# Patient Record
Sex: Male | Born: 1970 | Race: White | Hispanic: No | Marital: Married | Smoking: Never smoker
Health system: Southern US, Community
[De-identification: ages and names within clinical notes are randomized; demographics above are authoritative.]

## PROBLEM LIST (undated history)

## (undated) DIAGNOSIS — I4891 Unspecified atrial fibrillation: Secondary | ICD-10-CM

## (undated) DIAGNOSIS — E7800 Pure hypercholesterolemia, unspecified: Secondary | ICD-10-CM

## (undated) DIAGNOSIS — G4730 Sleep apnea, unspecified: Secondary | ICD-10-CM

## (undated) DIAGNOSIS — I10 Essential (primary) hypertension: Secondary | ICD-10-CM

## (undated) HISTORY — PX: MANDIBLE SURGERY: SHX707

## (undated) HISTORY — PX: BARIATRIC SURGERY: SHX1103

## (undated) HISTORY — PX: ANKLE SURGERY: SHX546

---

## 2003-09-21 ENCOUNTER — Emergency Department (HOSPITAL_COMMUNITY): Admission: AC | Admit: 2003-09-21 | Discharge: 2003-09-22 | Payer: Self-pay

## 2016-10-22 ENCOUNTER — Emergency Department (HOSPITAL_BASED_OUTPATIENT_CLINIC_OR_DEPARTMENT_OTHER): Payer: No Typology Code available for payment source

## 2016-10-22 ENCOUNTER — Emergency Department (HOSPITAL_BASED_OUTPATIENT_CLINIC_OR_DEPARTMENT_OTHER)
Admission: EM | Admit: 2016-10-22 | Discharge: 2016-10-22 | Disposition: A | Discharge status: Home / Self Care | Payer: No Typology Code available for payment source | Attending: Emergency Medicine | Admitting: Emergency Medicine

## 2016-10-22 ENCOUNTER — Encounter (HOSPITAL_BASED_OUTPATIENT_CLINIC_OR_DEPARTMENT_OTHER): Payer: Self-pay | Admitting: Emergency Medicine

## 2016-10-22 DIAGNOSIS — S301XXA Contusion of abdominal wall, initial encounter: Secondary | ICD-10-CM | POA: Insufficient documentation

## 2016-10-22 DIAGNOSIS — S50312A Abrasion of left elbow, initial encounter: Secondary | ICD-10-CM | POA: Diagnosis not present

## 2016-10-22 DIAGNOSIS — S39012A Strain of muscle, fascia and tendon of lower back, initial encounter: Secondary | ICD-10-CM

## 2016-10-22 DIAGNOSIS — M791 Myalgia: Secondary | ICD-10-CM | POA: Diagnosis not present

## 2016-10-22 DIAGNOSIS — Y92410 Unspecified street and highway as the place of occurrence of the external cause: Secondary | ICD-10-CM | POA: Diagnosis not present

## 2016-10-22 DIAGNOSIS — S80211A Abrasion, right knee, initial encounter: Secondary | ICD-10-CM | POA: Insufficient documentation

## 2016-10-22 DIAGNOSIS — Y999 Unspecified external cause status: Secondary | ICD-10-CM | POA: Insufficient documentation

## 2016-10-22 DIAGNOSIS — S3992XA Unspecified injury of lower back, initial encounter: Secondary | ICD-10-CM | POA: Diagnosis present

## 2016-10-22 DIAGNOSIS — S0990XA Unspecified injury of head, initial encounter: Secondary | ICD-10-CM | POA: Diagnosis not present

## 2016-10-22 DIAGNOSIS — Y9389 Activity, other specified: Secondary | ICD-10-CM | POA: Diagnosis not present

## 2016-10-22 DIAGNOSIS — M7918 Myalgia, other site: Secondary | ICD-10-CM

## 2016-10-22 DIAGNOSIS — S50812A Abrasion of left forearm, initial encounter: Secondary | ICD-10-CM | POA: Diagnosis not present

## 2016-10-22 HISTORY — DX: Unspecified atrial fibrillation: I48.91

## 2016-10-22 HISTORY — DX: Sleep apnea, unspecified: G47.30

## 2016-10-22 NOTE — ED Notes (Signed)
Patient transported to CT 

## 2016-10-22 NOTE — ED Notes (Signed)
Patient transported to X-ray 
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
## 2017-01-03 ENCOUNTER — Other Ambulatory Visit (HOSPITAL_BASED_OUTPATIENT_CLINIC_OR_DEPARTMENT_OTHER): Payer: Self-pay | Admitting: Family Medicine

## 2017-01-03 DIAGNOSIS — R1900 Intra-abdominal and pelvic swelling, mass and lump, unspecified site: Secondary | ICD-10-CM

## 2019-04-13 ENCOUNTER — Emergency Department (HOSPITAL_BASED_OUTPATIENT_CLINIC_OR_DEPARTMENT_OTHER)
Admission: EM | Admit: 2019-04-13 | Discharge: 2019-04-13 | Disposition: A | Discharge status: Home / Self Care | Payer: Medicare HMO | Attending: Emergency Medicine | Admitting: Emergency Medicine

## 2019-04-13 ENCOUNTER — Emergency Department (HOSPITAL_BASED_OUTPATIENT_CLINIC_OR_DEPARTMENT_OTHER): Payer: Medicare HMO

## 2019-04-13 ENCOUNTER — Encounter (HOSPITAL_BASED_OUTPATIENT_CLINIC_OR_DEPARTMENT_OTHER): Payer: Self-pay

## 2019-04-13 DIAGNOSIS — N50811 Right testicular pain: Secondary | ICD-10-CM

## 2019-04-13 DIAGNOSIS — R310 Gross hematuria: Secondary | ICD-10-CM

## 2019-04-13 DIAGNOSIS — N451 Epididymitis: Secondary | ICD-10-CM | POA: Diagnosis not present

## 2019-04-13 DIAGNOSIS — N50819 Testicular pain, unspecified: Secondary | ICD-10-CM

## 2019-04-13 LAB — URINALYSIS, ROUTINE W REFLEX MICROSCOPIC

## 2019-04-13 LAB — URINALYSIS, MICROSCOPIC (REFLEX)
RBC / HPF: >50 RBC/hpf (ref 0–5)
WBC, UA: >50 WBC/hpf (ref 0–5)

## 2019-04-13 MED ORDER — LEVOFLOXACIN 500 MG PO TABS
500.0000 mg | ORAL_TABLET | Freq: Once | ORAL | Status: AC
  Administered 2019-04-13: 500 mg via ORAL
  Filled 2019-04-13: qty 1

## 2019-04-13 MED ORDER — LEVOFLOXACIN 500 MG PO TABS: 500.0000 mg | ORAL_TABLET | Freq: Every day | ORAL | 0 refills | Status: AC

## 2019-04-13 NOTE — ED Notes (Signed)
Taken to US at this time. 

## 2019-04-13 NOTE — ED Notes (Signed)
Taken to CT at this time. 

## 2019-04-13 NOTE — ED Provider Notes (Signed)
Emergency Department Provider Note   I have reviewed the triage vital signs and the nursing notes.   HISTORY  Chief Complaint No chief complaint on file.   HPI Steven Harris is a 48 y.o. male patient with past medical history reviewed below presents to the emergency department with acute onset right testicle pain and swelling.  Symptoms were first noticed this morning.  He describes a dull, ache type pain worse in the right testicle.  He felt the area and could appreciate some swelling.  This has been intermittent throughout the day and worsening to some degree.  He has had some urine hesitancy but no dysuria.  No fevers or chills.  He initially went to urgent care and was immediately referred here for rule out of torsion.  Patient denies any trauma to the area.  No back or flank pain.  No abdominal discomfort. No other modifying factors.    Past Medical History:  Diagnosis Date  . A-fib ([place])   . Sleep apnea     There are no active problems to display for this patient.   Past Surgical History:  Procedure Laterality Date  . ANKLE SURGERY    . MANDIBLE SURGERY      Allergies Patient has no known allergies.  No family history on file.  Social History Social History   Tobacco Use  . Smoking status: Never Smoker  . Smokeless tobacco: Never Used  Substance Use Topics  . Alcohol use: No  . Drug use: No    Review of Systems  Constitutional: No fever/chills Gastrointestinal: No abdominal pain.   Genitourinary: Negative for dysuria. Positive right testicle pain and swelling.  Musculoskeletal: Negative for back pain. Skin: Negative for rash.  10-point ROS otherwise negative.  ____________________________________________   PHYSICAL EXAM:  VITAL SIGNS: ED Triage Vitals  Enc Vitals Group     BP 04/13/19 1653 (!) 167/85     Pulse Rate 04/13/19 1653 64     Resp 04/13/19 1653 20     Temp 04/13/19 1653 99.1 F (37.3 C)     Temp Source 04/13/19 1653 Oral   SpO2 04/13/19 1653 96 %     Weight 04/13/19 1654 (!) 410 lb (186 kg)     Height 04/13/19 1654 5\' 10"  (1.778 m)   Constitutional: Alert and oriented. Well appearing and in no acute distress. Eyes: Conjunctivae are normal.  Head: Atraumatic. Nose: No congestion/rhinnorhea. Mouth/Throat: Mucous membranes are moist.  Neck: No stridor.   Cardiovascular: Normal rate, regular rhythm.  Respiratory: Normal respiratory effort.   Gastrointestinal:  No distention.  Genitourinary: Exam performed with patient consent. Right testicle swelling with mild diffuse tenderness. Normal lye. No scrotal abscess/cellulitis.  Musculoskeletal:  No gross deformities of extremities. Neurologic:  Normal speech and language. Skin:  Skin is warm, dry and intact. No rash noted.   ____________________________________________   LABS (all labs ordered are listed, but only abnormal results are displayed)  Labs Reviewed  URINALYSIS, ROUTINE W REFLEX MICROSCOPIC - Abnormal; Notable for the following components:      Result Value   Color, Urine BROWN (*)    APPearance TURBID (*)    Glucose, UA   (*)    Value: TEST NOT REPORTED DUE TO COLOR INTERFERENCE OF URINE PIGMENT   Hgb urine dipstick   (*)    Value: TEST NOT REPORTED DUE TO COLOR INTERFERENCE OF URINE PIGMENT   Bilirubin Urine   (*)    Value: TEST NOT REPORTED DUE TO COLOR INTERFERENCE  OF URINE PIGMENT   Ketones, ur   (*)    Value: TEST NOT REPORTED DUE TO COLOR INTERFERENCE OF URINE PIGMENT   Protein, ur   (*)    Value: TEST NOT REPORTED DUE TO COLOR INTERFERENCE OF URINE PIGMENT   Nitrite   (*)    Value: TEST NOT REPORTED DUE TO COLOR INTERFERENCE OF URINE PIGMENT   Leukocytes,Ua   (*)    Value: TEST NOT REPORTED DUE TO COLOR INTERFERENCE OF URINE PIGMENT   All other components within normal limits  URINALYSIS, MICROSCOPIC (REFLEX) - Abnormal; Notable for the following components:   Bacteria, UA MANY (*)    All other components within normal limits   URINE CULTURE   ____________________________________________  RADIOLOGY  Ct Renal Stone Study  Result Date: 04/13/2019 CLINICAL DATA:  Hematuria of uncertain etiology. Right groin pain. EXAM: CT ABDOMEN AND PELVIS WITHOUT CONTRAST TECHNIQUE: Multidetector CT imaging of the abdomen and pelvis was performed following the standard protocol without IV contrast. COMPARISON:  01/24/2019 CT abdomen pelvis FINDINGS: LOWER CHEST: No basilar pleural or apical pericardial effusion. HEPATOBILIARY: Normal hepatic contours. There is no intra- or extrahepatic biliary dilatation. The gallbladder is normal. PANCREAS: Normal pancreatic contours without pancreatic ductal dilatation or peripancreatic fluid collection. SPLEEN: Normal. ADRENALS/URINARY TRACT: --Adrenal glands: Normal. --Right kidney/ureter: No hydronephrosis, nephroureterolithiasis or solid renal mass. --Left kidney/ureter: No hydronephrosis, nephroureterolithiasis or solid renal mass. --Urinary bladder: Normal for degree of distention STOMACH/BOWEL: --Stomach/Duodenum: Remote sleeve gastrectomy. Small sliding hiatal hernia. --Small bowel: No dilatation or inflammation. --Colon: No focal abnormality. --Appendix: Normal. VASCULAR/LYMPHATIC: Normal course and caliber of the major abdominal vessels. No abdominal or pelvic lymphadenopathy. REPRODUCTIVE: Normal prostate size with symmetric seminal vesicles. MUSCULOSKELETAL. There is grade 2 anterolisthesis at L5-S1 secondary to bilateral L5 pars interarticularis defects. OTHER: Small fat containing ventral abdominal hernia. IMPRESSION: 1. No obstructive uropathy or nephroureterolithiasis. 2. Grade 2 anterolisthesis at L5-S1 secondary to bilateral L5 pars interarticularis defects. Electronically Signed   By: Deatra RobinsonKevin  Herman M.D.   On: 04/13/2019 19:37   [place] Scrotum W/doppler  Result Date: 04/13/2019 CLINICAL DATA:  Right testicular pain and swelling EXAM: ULTRASOUND OF SCROTUM TECHNIQUE: Complete ultrasound  examination of the testicles, epididymis, and other scrotal structures was performed. COMPARISON:  None. FINDINGS: Right testicle Measurements: 4.7 x 3.0 x 3.5 cm. No mass or microlithiasis visualized. Left testicle Measurements: 4.8 x 5.1 x 3.0 cm. No mass or microlithiasis visualized. Right epididymis: Normal size of the right epididymis with a mildly heterogeneous and hypervascular tail. Left epididymis:  Normal in size and appearance. Hydrocele:  Trace left hydrocele. Varicocele:  None visualized. IMPRESSION: 1. Normal size of the right epididymis with a mildly heterogeneous and hypervascular tail. Findings can be seen in epididymitis. Correlate with urinalysis. 2. Normal size and appearance of the testicles. Normal Doppler flow bilaterally. Trace, likely physiologic left hydrocele. Electronically Signed   By: Lauralyn PrimesAlex  Bibbey M.D.   On: 04/13/2019 18:29    ____________________________________________   PROCEDURES  Procedure(s) performed:   Procedures  None ____________________________________________   INITIAL IMPRESSION / ASSESSMENT AND PLAN / ED COURSE  Pertinent labs & imaging results that were available during my care of the patient were reviewed by me and considered in my medical decision making (see chart for details).   Patient presents to the emergency department for evaluation of right testicle pain which has been mostly intermittent throughout the day.  He is having some discomfort currently.  My clinical suspicion for torsion is the lower compared to  alternate diagnoses of orchitis/epididymitis.  Patient is having some urinary symptoms as well.  Plan for UA along with emergent testicle ultrasound.   No torsion on [place]. Epididymitis likely. UA with hematuria. CT renal obtained to r/u stone which was negative. Plan for Abx and Urology follow up. Patient feeling well and comfortable with plan at discharge.  ____________________________________________  FINAL CLINICAL IMPRESSION(S) /  ED DIAGNOSES  Final diagnoses:  Epididymitis  Pain in right testicle  Gross hematuria     MEDICATIONS GIVEN DURING THIS VISIT:  Medications  levofloxacin (LEVAQUIN) tablet 500 mg (500 mg Oral Given 04/13/19 1945)     NEW OUTPATIENT MEDICATIONS STARTED DURING THIS VISIT:  Discharge Medication List as of 04/13/2019  7:44 PM    START taking these medications   Details  levofloxacin (LEVAQUIN) 500 MG tablet Take 1 tablet (500 mg total) by mouth daily for 9 days., Starting Mon 04/14/2019, Until Wed 04/23/2019, Print        Note:  This document was prepared using Dragon voice recognition software and may include unintentional dictation errors.  Alona Bene, MD, [facility]    , Arlyss Repress, MD 04/14/19 [phone]

## 2019-04-13 NOTE — ED Triage Notes (Signed)
Pt c/o onset of R testicular pain that is intermittent. Pain is worse with standing. Pt thinks there may be some swelling noted.

## 2019-04-13 NOTE — Discharge Instructions (Signed)
You were seen in the emergency department today with pain in the groin area.  Your ultrasound showed some infection.  We are starting on antibiotics to take for the next 10 days.  If you develop any severe pain in your legs or tendon areas you should stop taking the antibiotic and return to the emergency department.  Please call your primary care doctor tomorrow to set up a follow-up appointment.  I have given information for a urologist should your symptoms continue or worsen slowly.  If you have any new or suddenly worsening symptoms you should return to the emergency department.

## 2019-04-15 LAB — URINE CULTURE: Culture: 60000 — AB

## 2019-04-16 ENCOUNTER — Telehealth: Payer: Self-pay | Admitting: *Deleted

## 2019-04-16 NOTE — Telephone Encounter (Signed)
Post ED Visit - Positive Culture Follow-up  Culture report reviewed by antimicrobial stewardship pharmacist: [place] Team []  Elenor Quinones, Pharm.D. []  Heide Guile, Pharm.D., BCPS AQ-ID []  Parks Neptune, Pharm.D., BCPS []  Alycia Rossetti, Pharm.D., BCPS []  [place], [place].D., BCPS, AAHIVP []  Legrand Como, Pharm.D., BCPS, AAHIVP [x]  Salome Arnt, PharmD, BCPS []  Johnnette Gourd, PharmD, BCPS []  Hughes Better, PharmD, BCPS []  Leeroy Cha, PharmD []  Laqueta Linden, PharmD, BCPS []  Albertina Parr, PharmD  [place] Team []  Leodis Sias, PharmD []  Lindell Spar, PharmD []  Royetta Asal, PharmD []  Graylin Shiver, Rph []  Rema Fendt) Glennon Mac, PharmD []  Arlyn Dunning, PharmD []  Netta Cedars, PharmD []  Dia Sitter, PharmD []  Leone Haven, PharmD []  Gretta Arab, PharmD []  Theodis Shove, PharmD []  Peggyann Juba, PharmD []  Reuel Boom, PharmD   Positive urine culture Treated with Levofloxacin, organism sensitive to the same and no further patient follow-up is required at this time.  Harlon Flor [facility] 04/16/2019, 11:46 AM

## 2019-04-27 ENCOUNTER — Encounter (HOSPITAL_BASED_OUTPATIENT_CLINIC_OR_DEPARTMENT_OTHER): Payer: Self-pay | Admitting: Emergency Medicine

## 2019-04-27 ENCOUNTER — Emergency Department (HOSPITAL_BASED_OUTPATIENT_CLINIC_OR_DEPARTMENT_OTHER)
Admission: EM | Admit: 2019-04-27 | Discharge: 2019-04-27 | Disposition: A | Discharge status: Home / Self Care | Payer: Medicare HMO | Attending: Emergency Medicine | Admitting: Emergency Medicine

## 2019-04-27 ENCOUNTER — Other Ambulatory Visit: Payer: Self-pay

## 2019-04-27 DIAGNOSIS — L03116 Cellulitis of left lower limb: Secondary | ICD-10-CM | POA: Insufficient documentation

## 2019-04-27 DIAGNOSIS — Z7982 Long term (current) use of aspirin: Secondary | ICD-10-CM | POA: Insufficient documentation

## 2019-04-27 DIAGNOSIS — M79605 Pain in left leg: Secondary | ICD-10-CM | POA: Diagnosis present

## 2019-04-27 DIAGNOSIS — I4891 Unspecified atrial fibrillation: Secondary | ICD-10-CM | POA: Diagnosis not present

## 2019-04-27 DIAGNOSIS — Z79899 Other long term (current) drug therapy: Secondary | ICD-10-CM | POA: Insufficient documentation

## 2019-04-27 MED ORDER — DOXYCYCLINE HYCLATE 100 MG PO CAPS: 100.0000 mg | ORAL_CAPSULE | Freq: Two times a day (BID) | ORAL | 0 refills | Status: DC

## 2019-04-27 NOTE — Discharge Instructions (Signed)
°  Please take all of your antibiotics until finished!   You may develop abdominal discomfort or diarrhea from the antibiotic.  You may help offset this with probiotics which you can buy or get in yogurt. Do not eat or take the probiotics until 2 hours after your antibiotic.   Follow-up: Follow-up with your primary care provider for continued management of this issue.  Return: Return to the emergency department for spreading redness, fever, significantly increased pain or swelling, or any other major concerns.

## 2019-04-27 NOTE — ED Triage Notes (Signed)
Pt reports painful knot to L calf today.

## 2019-04-27 NOTE — ED Provider Notes (Signed)
[facility] EMERGENCY DEPARTMENT Provider Note   CSN: [id number] Arrival date & time: 04/27/19  0932     History   Chief Complaint Chief Complaint  Patient presents with  . Leg Pain    HPI Steven Harris is a 48 y.o. male.     HPI   Steven Harris is a 48 y.o. male, with a history of A. fib anticoagulated with Xarelto, presenting to the ED with area of pain and swelling to the left lateral calf noted this morning. Patient states he has a history of cellulitis and is concerned about recurrence of this, though he has not had an occurrence of cellulitis in the last few months. Pain is described as a soreness and burning, states that is isolated to the skin and does not extend deeper, moderate in intensity, nonradiating.  He adds he is currently on a 2-week course of levofloxacin for epididymitis starting 10/25. Denies fever/chills, nausea/vomiting, numbness, weakness, falls/trauma, shortness of breath, chest pain, or any other complaints.   Past Medical History:  Diagnosis Date  . A-fib (HCC)   . Sleep apnea     There are no active problems to display for this patient.   Past Surgical History:  Procedure Laterality Date  . ANKLE SURGERY    . MANDIBLE SURGERY          Home Medications    Prior to Admission medications   Medication Sig Start Date End Date Taking? Authorizing Provider  aspirin 81 MG chewable tablet Chew by mouth daily.   Yes [provider]  atorvastatin (LIPITOR) 40 MG tablet Take 40 mg by mouth daily.   Yes [provider]  flecainide (TAMBOCOR) 50 MG tablet Take 50 mg by mouth 2 (two) times daily.   Yes [provider]  isosorbide dinitrate (ISORDIL) 30 MG tablet Take 60 mg by mouth 2 (two) times daily.   Yes [provider]  levofloxacin (LEVAQUIN) 500 MG tablet Take 500 mg by mouth daily.   Yes [provider]  Multiple Vitamin (MULTIVITAMIN) tablet Take 1 tablet by mouth daily.   Yes  [provider]  omeprazole (PRILOSEC) 20 MG capsule Take 20 mg by mouth 2 (two) times daily before a meal.   Yes [provider]  rivaroxaban (XARELTO) 20 MG TABS tablet Take 20 mg by mouth daily with supper.   Yes [provider]  doxycycline (VIBRAMYCIN) 100 MG capsule Take 1 capsule (100 mg total) by mouth 2 (two) times daily. 04/27/19   Ellia Knowlton, Hillard DankerShawn C, PA-C    Family History No family history on file.  Social History Social History   Tobacco Use  . Smoking status: Never Smoker  . Smokeless tobacco: Never Used  Substance Use Topics  . Alcohol use: No  . Drug use: No     Allergies   Patient has no known allergies.   Review of Systems Review of Systems  Constitutional: Negative for chills, diaphoresis and fever.  Respiratory: Negative for shortness of breath.   Cardiovascular: Negative for chest pain.  Gastrointestinal: Negative for nausea and vomiting.  Skin: Positive for color change. Negative for wound.       Small area of pain and swelling to the left calf  Neurological: Negative for weakness and numbness.  All other systems reviewed and are negative.    Physical Exam Updated Vital Signs BP (!) 165/101 (BP Location: Right Arm)   Pulse (!) 58   Temp 98.4 F (36.9 C) (Oral)   Resp  20   Ht 5\' 10"  (1.778 m)   Wt (!) 183.7 kg   SpO2 98%   BMI 58.11 kg/m   Physical Exam Vitals signs and nursing note reviewed.  Constitutional:      General: He is not in acute distress.    Appearance: He is well-developed. He is obese. He is not diaphoretic.  HENT:     Head: Normocephalic and atraumatic.     Mouth/Throat:     Mouth: Mucous membranes are moist.     Pharynx: Oropharynx is clear.  Eyes:     Conjunctiva/sclera: Conjunctivae normal.  Neck:     Musculoskeletal: Neck supple.  Cardiovascular:     Rate and Rhythm: Normal rate and regular rhythm.     Pulses: Normal pulses.          Radial pulses are 2+ on the right side and 2+ on the  left side.       Posterior tibial pulses are 2+ on the right side and 2+ on the left side.     Comments: Tactile temperature in the extremities appropriate and equal bilaterally. Pulmonary:     Effort: Pulmonary effort is normal. No respiratory distress.  Abdominal:     Tenderness: There is no guarding.  Musculoskeletal:     Comments: Small area of swelling approximately 2 cm in diameter to left lateral calf with approximately 5 to 6 cm diameter erythema surrounding.  Localized tenderness over the area of swelling, however, no tenderness anywhere else on the calf. Full range of motion in the left knee and ankle without difficulty or pain.  Lymphadenopathy:     Cervical: No cervical adenopathy.  Skin:    General: Skin is warm and dry.  Neurological:     Mental Status: He is alert.  Psychiatric:        Mood and Affect: Mood and affect normal.        Speech: Speech normal.        Behavior: Behavior normal.      ED Treatments / Results  Labs (all labs ordered are listed, but only abnormal results are displayed) Labs Reviewed - No data to display  EKG None  Radiology No results found.  Procedures Ultrasound ED Soft Tissue  Date/Time: 04/27/2019 1:55 PM Performed by: Lorayne Bender, PA-C Authorized by: Lorayne Bender, PA-C   Procedure details:    Indications: localization of abscess and evaluate for cellulitis     Transverse view:  Visualized   Longitudinal view:  Visualized   Images: archived     Limitations:  Body habitus Location:    Location: lower extremity     Side:  Left Findings:     no abscess present    cellulitis present   (including critical care time)  Medications Ordered in ED Medications - No data to display   Initial Impression / Assessment and Plan / ED Course  I have reviewed the triage vital signs and the nursing notes.  Pertinent labs & imaging results that were available during my care of the patient were reviewed by me and considered in my  medical decision making (see chart for details).        Patient presents with an area of pain and swelling to left calf.  Findings on ultrasound support localized cellulitis without evidence of abscess.  Vital signs do not suggest sepsis or more severe infection.  Physical exam findings do not suggest DVT and suspicion for DVT is low especially with patient's anticoagulation.  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  We will have to start an additional antibiotic. PCP follow-up for continued management. The patient was given instructions for home care as well as return precautions. Patient voices understanding of these instructions, accepts the plan, and is comfortable with discharge.   Findings and plan of care discussed with Raeford Razor, MD.   Final Clinical Impressions(s) / ED Diagnoses   Final diagnoses:  Cellulitis of left lower extremity    ED Discharge Orders         Ordered    doxycycline (VIBRAMYCIN) 100 MG capsule  2 times daily     04/27/19 1211           Anselm Pancoast, PA-C 04/27/19 1221    Raeford Razor, MD 04/29/19 [phone]

## 2019-07-29 ENCOUNTER — Encounter (HOSPITAL_BASED_OUTPATIENT_CLINIC_OR_DEPARTMENT_OTHER): Payer: Self-pay | Admitting: Emergency Medicine

## 2019-07-29 ENCOUNTER — Emergency Department (HOSPITAL_BASED_OUTPATIENT_CLINIC_OR_DEPARTMENT_OTHER): Payer: Medicare HMO

## 2019-07-29 ENCOUNTER — Emergency Department (HOSPITAL_BASED_OUTPATIENT_CLINIC_OR_DEPARTMENT_OTHER)
Admission: EM | Admit: 2019-07-29 | Discharge: 2019-07-29 | Disposition: A | Discharge status: Home / Self Care | Payer: Medicare HMO | Attending: Emergency Medicine | Admitting: Emergency Medicine

## 2019-07-29 ENCOUNTER — Other Ambulatory Visit: Payer: Self-pay

## 2019-07-29 DIAGNOSIS — Z7901 Long term (current) use of anticoagulants: Secondary | ICD-10-CM | POA: Diagnosis not present

## 2019-07-29 DIAGNOSIS — Z79899 Other long term (current) drug therapy: Secondary | ICD-10-CM | POA: Diagnosis not present

## 2019-07-29 DIAGNOSIS — S161XXA Strain of muscle, fascia and tendon at neck level, initial encounter: Secondary | ICD-10-CM | POA: Insufficient documentation

## 2019-07-29 DIAGNOSIS — Y92410 Unspecified street and highway as the place of occurrence of the external cause: Secondary | ICD-10-CM | POA: Insufficient documentation

## 2019-07-29 DIAGNOSIS — I4891 Unspecified atrial fibrillation: Secondary | ICD-10-CM | POA: Insufficient documentation

## 2019-07-29 DIAGNOSIS — S60222A Contusion of left hand, initial encounter: Secondary | ICD-10-CM

## 2019-07-29 DIAGNOSIS — S39012A Strain of muscle, fascia and tendon of lower back, initial encounter: Secondary | ICD-10-CM

## 2019-07-29 DIAGNOSIS — Y9389 Activity, other specified: Secondary | ICD-10-CM | POA: Diagnosis not present

## 2019-07-29 DIAGNOSIS — Z7982 Long term (current) use of aspirin: Secondary | ICD-10-CM | POA: Insufficient documentation

## 2019-07-29 DIAGNOSIS — S0990XA Unspecified injury of head, initial encounter: Secondary | ICD-10-CM | POA: Diagnosis present

## 2019-07-29 DIAGNOSIS — Y999 Unspecified external cause status: Secondary | ICD-10-CM | POA: Insufficient documentation

## 2019-07-29 MED ORDER — METHOCARBAMOL 500 MG PO TABS: 500.0000 mg | ORAL_TABLET | Freq: Three times a day (TID) | ORAL | 0 refills | Status: AC | PRN
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
## 2019-07-29 NOTE — ED Triage Notes (Signed)
Superficial lac to front, right side head, bandage applied in triage

## 2019-07-29 NOTE — Discharge Instructions (Signed)
Steven Harris,   We obtained imaging and results did not show any acute abnormalities. The source of your pain is likely muscular. It may worsen in the next 1-2 days, so we are sending a prescription for a muscle relaxer called Methocarbamol (Robaxin) to the pharmacy. Please avoid driving or using heavy machinery while taking this medication, as it can make you drowsy.   In addition, we recommend using Tylenol or Ibuprofen to alleviate the pain as well. Please follow instructions on the bottle for whichever you choose. For Tylenol, do not exceed 4000 mg per 24 hour period.    If you should develop any:   - worsening pain - severe headache - confusion - vomiting - numbness/tingling in arms/leg - incontinence  - difficult walking  Please return to the ED immediately

## 2021-08-18 ENCOUNTER — Other Ambulatory Visit: Payer: Self-pay

## 2021-08-18 ENCOUNTER — Emergency Department (HOSPITAL_BASED_OUTPATIENT_CLINIC_OR_DEPARTMENT_OTHER)
Admission: EM | Admit: 2021-08-18 | Discharge: 2021-08-18 | Disposition: A | Discharge status: Home / Self Care | Payer: Medicare HMO | Attending: Emergency Medicine | Admitting: Emergency Medicine

## 2021-08-18 ENCOUNTER — Encounter (HOSPITAL_BASED_OUTPATIENT_CLINIC_OR_DEPARTMENT_OTHER): Payer: Self-pay | Admitting: *Deleted

## 2021-08-18 DIAGNOSIS — S0100XA Unspecified open wound of scalp, initial encounter: Secondary | ICD-10-CM | POA: Insufficient documentation

## 2021-08-18 DIAGNOSIS — W268XXA Contact with other sharp object(s), not elsewhere classified, initial encounter: Secondary | ICD-10-CM | POA: Diagnosis not present

## 2021-08-18 DIAGNOSIS — I4891 Unspecified atrial fibrillation: Secondary | ICD-10-CM | POA: Insufficient documentation

## 2021-08-18 HISTORY — DX: Pure hypercholesterolemia, unspecified: E78.00

## 2021-08-18 MED ORDER — LIDOCAINE-EPINEPHRINE (PF) 2 %-1:200000 IJ SOLN
10.0000 mL | Freq: Once | INTRAMUSCULAR | Status: AC
  Administered 2021-08-18: 10 mL
  Filled 2021-08-18: qty 20

## 2021-08-18 MED ORDER — SILVER NITRATE-POT NITRATE 75-25 % EX MISC
1.0000 | Freq: Once | CUTANEOUS | Status: AC
  Administered 2021-08-18: 1 via TOPICAL
  Filled 2021-08-18: qty 10

## 2021-08-18 NOTE — ED Triage Notes (Addendum)
Yesterday he was shaving his head and cut his scalp. He takes blood thinners and has not been able to stop the bleeding with pressure.  ?

## 2021-08-18 NOTE — ED Provider Notes (Signed)
? ?Emergency Department Provider Note ? ? ?I have reviewed the triage vital signs and the nursing notes. ? ? ?HISTORY ? ?Chief Complaint ?Laceration ? ? ?HPI ?Braylynn Goodfellow is a 51 y.o. male  presents to the ED with a scalp wound with persistent bleeding. Patient was shaving his head last night when he inadvertently shaved the top layer of skin on the crown of the head. He has tried to stop bleeding but has been unable to do so. He arrives with compression type dressing in place. He is not anticoagulated.  ? ? ?Past Medical History:  ?Diagnosis Date  ? A-fib ([place])   ? High cholesterol   ? Sleep apnea   ? ? ?Review of Systems ? ?Constitutional: No fever/chills ?Cardiovascular: Denies chest pain. ?Respiratory: Denies shortness of breath. ?Gastrointestinal: No abdominal pain.  ?Skin: bleeding wound to the crown of the head.  ? ?____________________________________________ ? ? ?PHYSICAL EXAM: ? ?VITAL SIGNS: ?ED Triage Vitals  ?Enc Vitals Group  ?   BP 08/18/21 1757 (!) 179/109  ?   Pulse Rate 08/18/21 1757 69  ?   Resp 08/18/21 1757 20  ?   Temp 08/18/21 1757 98.4 ?F (36.9 ?C)  ?   Temp Source 08/18/21 1757 Oral  ?   SpO2 08/18/21 1757 97 %  ?   Weight 08/18/21 1755 (!) 465 lb (210.9 kg)  ?   Height 08/18/21 1755 5\' 10"  (1.778 m)  ? ?Constitutional: Alert and oriented. Well appearing and in no acute distress. ?Eyes: Conjunctivae are normal. ?Head: Atraumatic. 3 x 4 cm patch of oozing scalp to the crown of the head. No laceration or hematoma.  ?Nose: No congestion/rhinnorhea. ?Mouth/Throat: Mucous membranes are moist.  ?Neck: No stridor.  ?Cardiovascular: Good peripheral circulation.   ?Respiratory: Normal respiratory effort.   ?Gastrointestinal: No distention.  ?Musculoskeletal: No gross deformities of extremities. ?Neurologic:  Normal speech and language.  ?Skin:  Skin is warm and dry. Oozing wound as above.  ? ? ?____________________________________________ ? ? ?PROCEDURES ? ?Procedure(s) performed:  ? ?Wound  repair ? ?Date/Time: 08/27/2021 8:45 PM ?Performed by: Margette Fast, MD ?Authorized by: Margette Fast, MD  ?Consent: Verbal consent obtained. ?Risks and benefits: risks, benefits and alternatives were discussed ?Consent given by: patient ?Patient identity confirmed: verbally with patient ?Local anesthesia used: yes ?Anesthesia: local infiltration ? ?Anesthesia: ?Local anesthesia used: yes ?Local Anesthetic: lidocaine 1% with epinephrine ?Anesthetic total: 6 mL ? ?Sedation: ?Patient sedated: no ? ?Patient tolerance: patient tolerated the procedure well with no immediate complications ?Comments: After local anesthesia, silver nitrate used to cauterize the oozing capillary bed of tissue. Wound is hemostatic.  ? ? ? ? ?____________________________________________ ? ? ?INITIAL IMPRESSION / ASSESSMENT AND PLAN / ED COURSE ? ?Pertinent labs & imaging results that were available during my care of the patient were reviewed by me and considered in my medical decision making (see chart for details). ?  ? ?Critical Interventions-  ?  ?Medications  ?lidocaine-EPINEPHrine (XYLOCAINE W/EPI) 2 %-1:200000 (PF) injection 10 mL (10 mLs Infiltration Given 08/18/21 1905)  ?silver nitrate applicators applicator 1 Stick (1 Stick Topical Given 08/18/21 1905)  ? ? ?Reassessment after intervention:  Wound is hemostatic after treatment.  ? ?Medical Decision Making: Summary:  ?Patient with oozing scalp wound. Able to achieve hemostasis. Patient is UTD on tetanus. Discussed wound care and ED return.  ? ?Reevaluation with update and discussion with patient. No breakthrough bleeding.  ? ?Disposition: discharge  ? ?____________________________________________ ? ?FINAL CLINICAL IMPRESSION(S) / ED  DIAGNOSES ? ?Final diagnoses:  ?Open wound of scalp, unspecified open wound type, initial encounter  ? ? ? ?Note:  This document was prepared using Dragon voice recognition software and may include unintentional dictation errors. ? ?Nanda Quinton, MD,  FACEP ?Emergency Medicine ? ?  ?Margette Fast, MD ?08/27/21 2048 ? ?

## 2021-08-18 NOTE — Discharge Instructions (Addendum)
You were seen in the emergency department today with bleeding scalp wound.  Please keep a dressing over this for the next 48 hours.  Return with any new or suddenly worsening symptoms. ?

## 2021-09-22 IMAGING — CT CT RENAL STONE PROTOCOL
2 of 4 series · 16 of 46 positions shown, 18 images · non-contrast
Comparison: 01/24/2019 CT abdomen pelvis

CLINICAL DATA: Hematuria of uncertain etiology. Right groin pain.

EXAM:
CT ABDOMEN AND PELVIS WITHOUT CONTRAST
TECHNIQUE: Multidetector CT imaging of the abdomen and pelvis was performed
following the standard protocol without IV contrast.

[Series 2: axial st · axial · 0.98mm/px · z∈[-538,-123]mm · 13 of 91 slices shown, 15 images]
[im 4/91  soft-tissue]
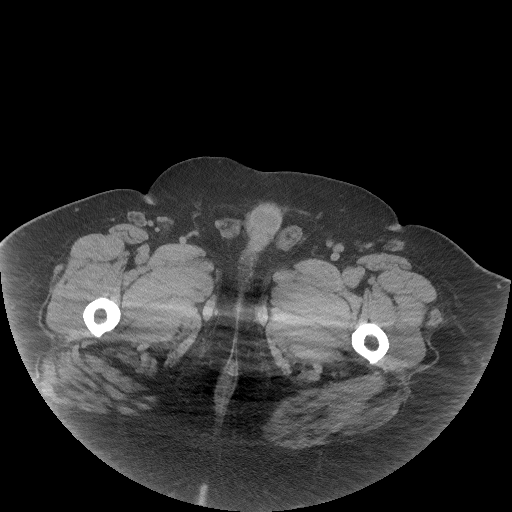
[im 4/91  bone]
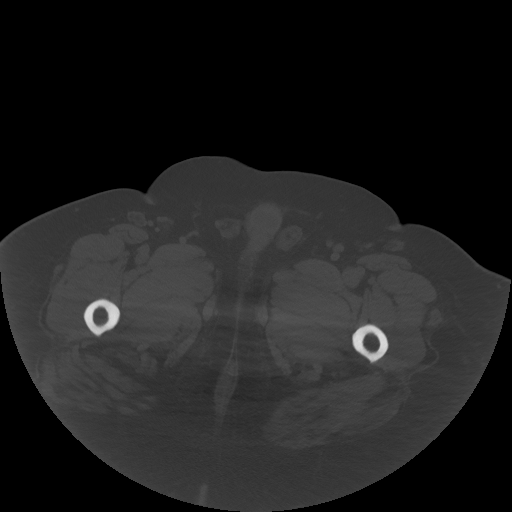
[im 11/91  soft-tissue]
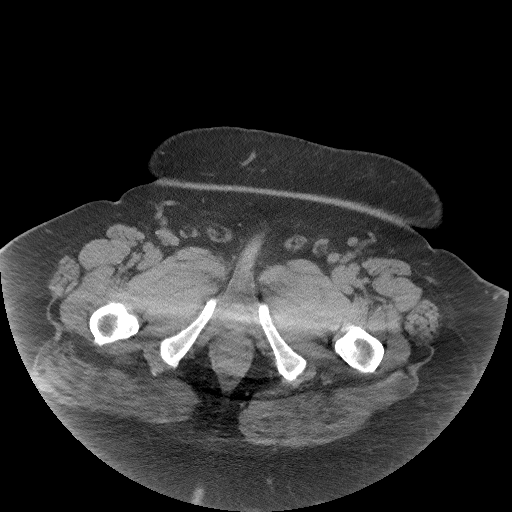
[im 19/91  soft-tissue]
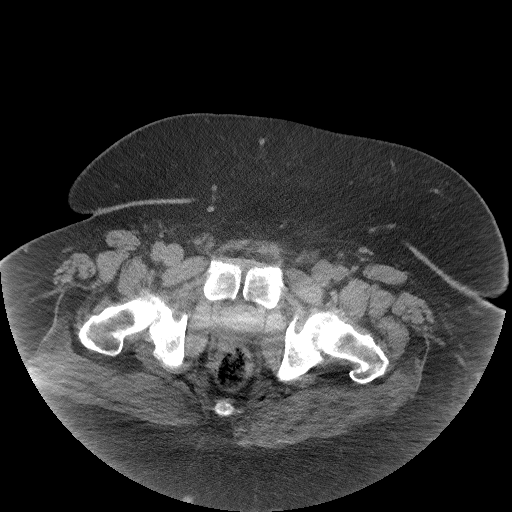
[im 26/91  soft-tissue]
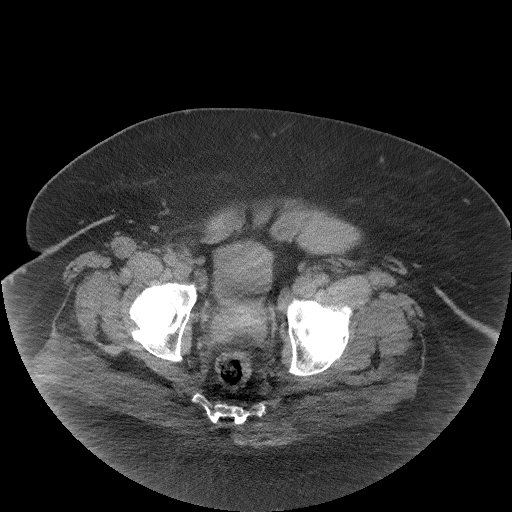
[im 33/91  soft-tissue]
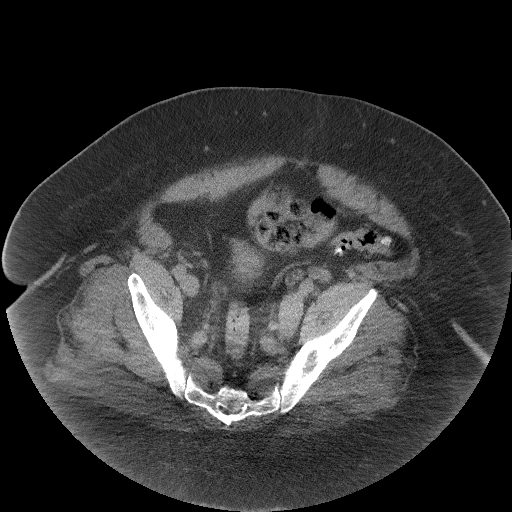
[im 40/91  soft-tissue]
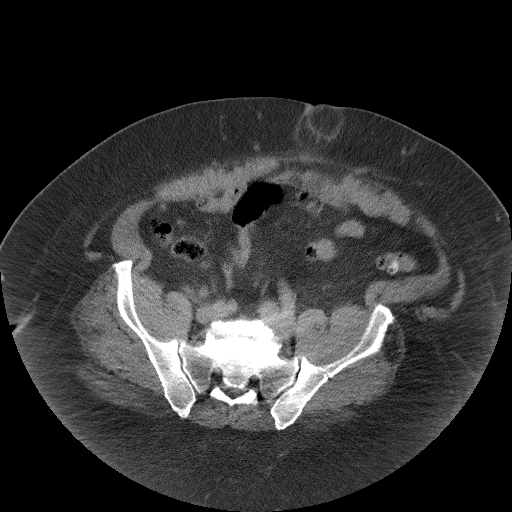
[im 47/91  soft-tissue]
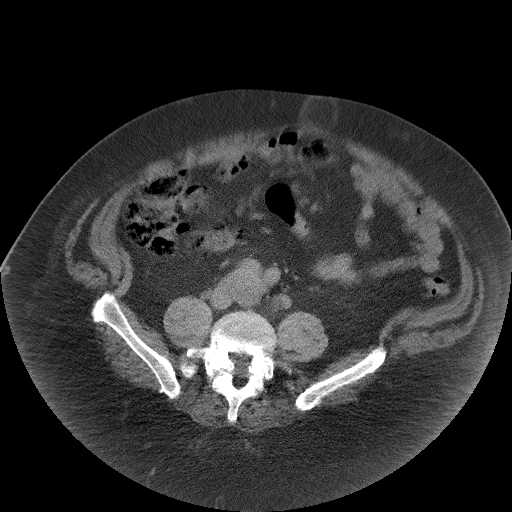
[im 51/91  soft-tissue]
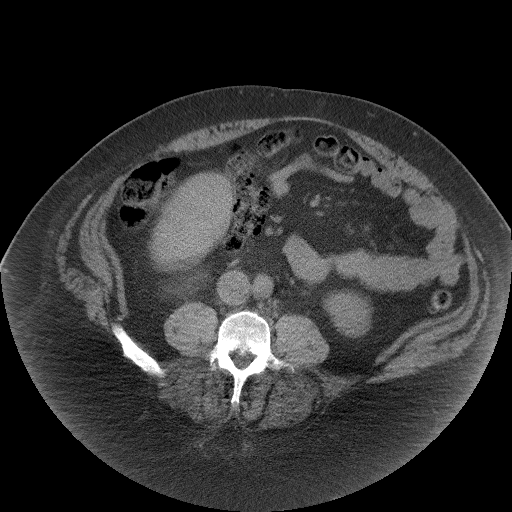
[im 58/91  soft-tissue]
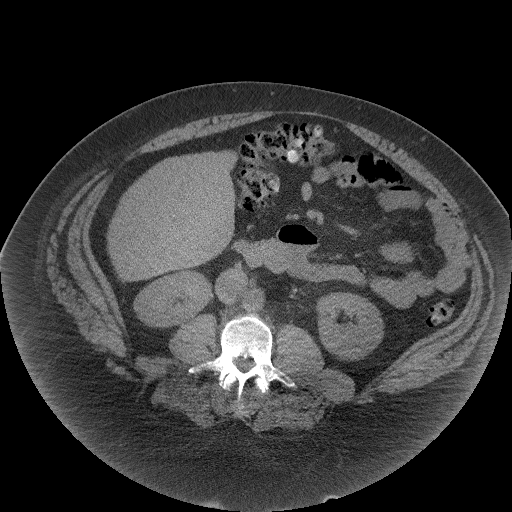
[im 58/91  bone]
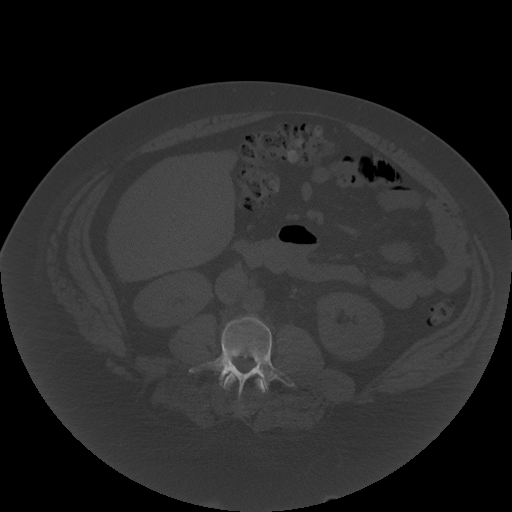
[im 65/91  soft-tissue]
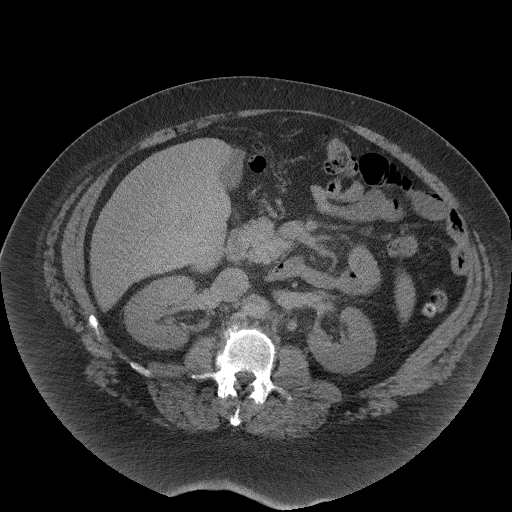
[im 73/91  soft-tissue]
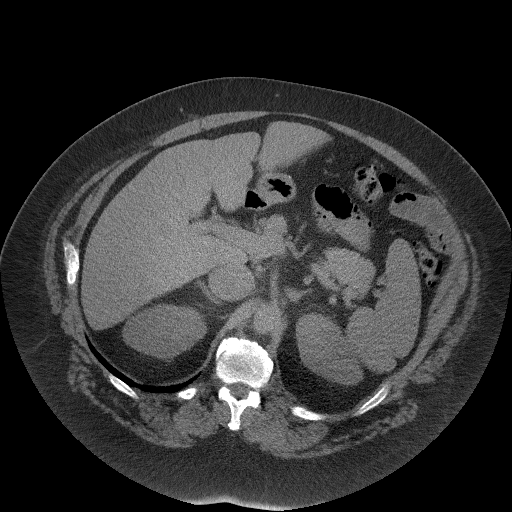
[im 80/91  soft-tissue]
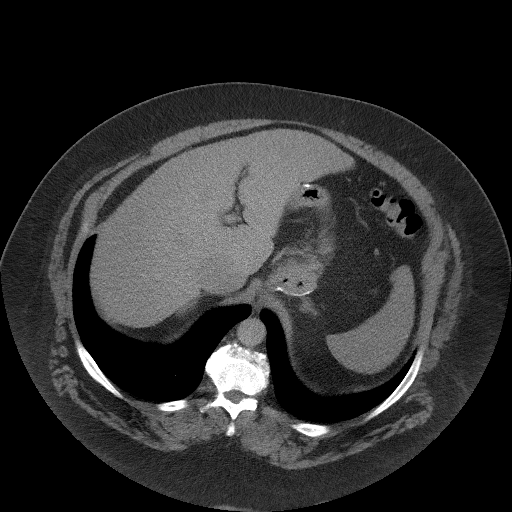
[im 87/91  soft-tissue]
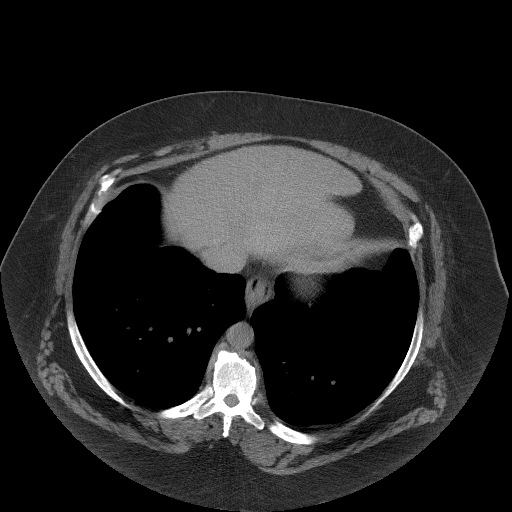

[Series 5: coronal st · coronal · 0.87mm/px · 3 of 143 slices shown]
[im 48/143  soft-tissue]
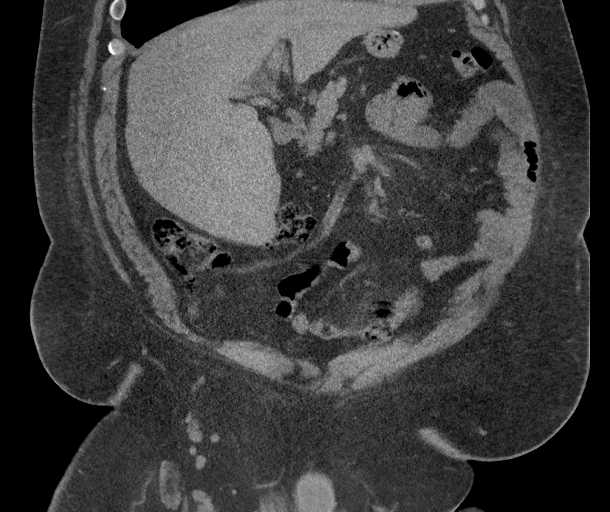
[im 64/143  soft-tissue]
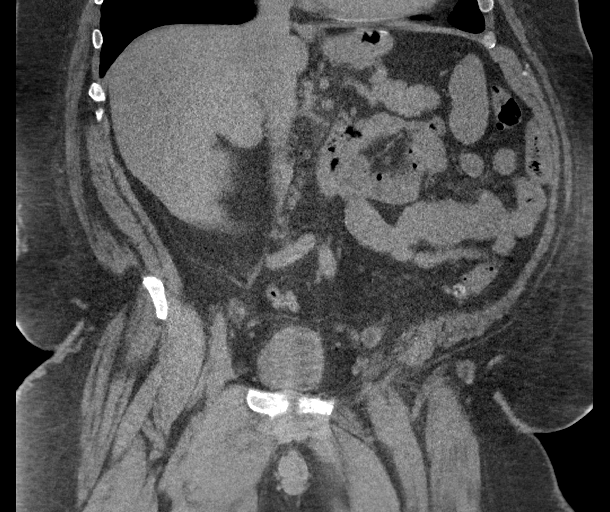
[im 79/143  soft-tissue]
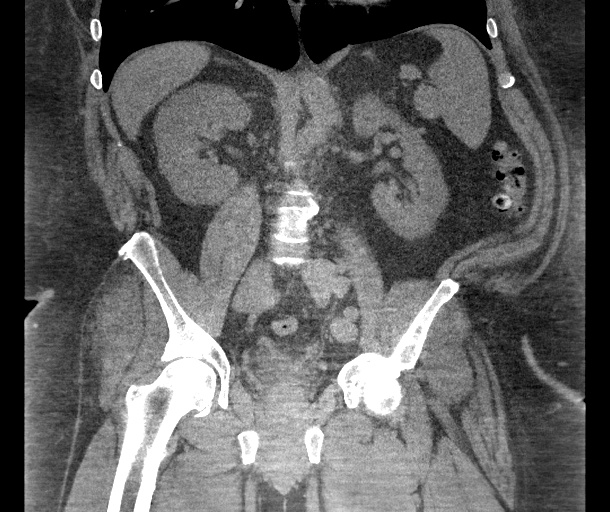

[16 of 46 positions shown; findings below may reference images not displayed]

FINDINGS: LOWER CHEST: No basilar pleural or apical pericardial effusion.

HEPATOBILIARY: Normal hepatic contours. There is no intra- or
extrahepatic biliary dilatation. The gallbladder is normal.

PANCREAS: Normal pancreatic contours without pancreatic ductal
dilatation or peripancreatic fluid collection.

SPLEEN: Normal.

ADRENALS/URINARY TRACT:

--Adrenal glands: Normal.

--Right kidney/ureter: No hydronephrosis, nephroureterolithiasis or
solid renal mass.

--Left kidney/ureter: No hydronephrosis, nephroureterolithiasis or
solid renal mass.

--Urinary bladder: Normal for degree of distention

STOMACH/BOWEL:

--Stomach/Duodenum: Remote sleeve gastrectomy. Small sliding hiatal
hernia.

--Small bowel: No dilatation or inflammation.

--Colon: No focal abnormality.

--Appendix: Normal.

VASCULAR/LYMPHATIC: Normal course and caliber of the major abdominal
vessels. No abdominal or pelvic lymphadenopathy.

REPRODUCTIVE: Normal prostate size with symmetric seminal vesicles.

MUSCULOSKELETAL. There is grade 2 anterolisthesis at L5-S1 secondary
to bilateral L5 pars interarticularis defects.

OTHER: Small fat containing ventral abdominal hernia.
IMPRESSION: 1. No obstructive uropathy or nephroureterolithiasis.
2. Grade 2 anterolisthesis at L5-S1 secondary to bilateral L5 pars
interarticularis defects.

## 2021-10-29 ENCOUNTER — Other Ambulatory Visit: Payer: Self-pay

## 2021-10-29 ENCOUNTER — Encounter (HOSPITAL_BASED_OUTPATIENT_CLINIC_OR_DEPARTMENT_OTHER): Payer: Self-pay | Admitting: Emergency Medicine

## 2021-10-29 ENCOUNTER — Emergency Department (HOSPITAL_BASED_OUTPATIENT_CLINIC_OR_DEPARTMENT_OTHER)
Admission: EM | Admit: 2021-10-29 | Discharge: 2021-10-29 | Disposition: A | Discharge status: Home / Self Care | Payer: Medicare HMO | Attending: Physician Assistant | Admitting: Physician Assistant

## 2021-10-29 DIAGNOSIS — N390 Urinary tract infection, site not specified: Secondary | ICD-10-CM | POA: Diagnosis not present

## 2021-10-29 DIAGNOSIS — M79605 Pain in left leg: Secondary | ICD-10-CM | POA: Insufficient documentation

## 2021-10-29 DIAGNOSIS — Z7982 Long term (current) use of aspirin: Secondary | ICD-10-CM | POA: Diagnosis not present

## 2021-10-29 DIAGNOSIS — Z7901 Long term (current) use of anticoagulants: Secondary | ICD-10-CM | POA: Insufficient documentation

## 2021-10-29 HISTORY — DX: Essential (primary) hypertension: I10

## 2021-10-29 HISTORY — DX: Unspecified atrial fibrillation: I48.91

## 2021-10-29 LAB — URINALYSIS, ROUTINE W REFLEX MICROSCOPIC
Glucose, UA: NEGATIVE mg/dL
Ketones, ur: NEGATIVE mg/dL
Leukocytes,Ua: NEGATIVE
Nitrite: NEGATIVE
Protein, ur: >=300 mg/dL — AB
Specific Gravity, Urine: >=1.03 (ref 1.005–1.030)
pH: 5 (ref 5.0–8.0)

## 2021-10-29 LAB — URINALYSIS, MICROSCOPIC (REFLEX)

## 2021-10-29 MED ORDER — DOXYCYCLINE HYCLATE 100 MG PO CAPS: 100.0000 mg | ORAL_CAPSULE | Freq: Two times a day (BID) | ORAL | 0 refills | Status: AC

## 2021-10-29 MED ORDER — CEPHALEXIN 500 MG PO CAPS: 500.0000 mg | ORAL_CAPSULE | Freq: Four times a day (QID) | ORAL | 0 refills | Status: AC

## 2021-10-29 NOTE — ED Notes (Signed)
Patient states that he sill cannot void ?

## 2021-10-29 NOTE — ED Notes (Signed)
[

## 2021-10-29 NOTE — Discharge Instructions (Signed)
See your Physicain for recheck on Monday.  Return if any problems   ?

## 2021-10-29 NOTE — ED Provider Notes (Signed)
?[facility] EMERGENCY DEPARTMENT ?Provider Note ? ? ?CSN: [id number] ?Arrival date & time: 10/29/21  0840 ? ?  ? ?History ? ?Chief Complaint  ?Patient presents with  ? Leg Pain  ? ? ?Steven Harris is a 51 y.o. male. ? ?Patient reports that he is concerned that he has a urinary tract infection patient reports that he has been leaking urine he has had urinary tract infections in the past patient is also concerned that he could be developing a cellulitis to his left leg he has had cellulitis in the past.  Patient states that he is an area of soreness in the left lower lateral aspect of his leg he has had cellulitis in this area in the past he normally feels discomfort before he sees redness.  Patient tells me that his primary care physician normally starts him on antibiotics whenever he has discomfort to try to stop him from developing cellulitis.  Patient denies having any fever or chills he has not had any other symptoms. ? ?The history is provided by the patient. No language interpreter was used.  ?Leg Pain ?Pain details:  ?  Quality:  Aching ?  Radiates to:  Does not radiate ?  Timing:  Constant ?  Progression:  Worsening ?Chronicity:  New ?Relieved by:  Nothing ?Worsened by:  Nothing ?Ineffective treatments:  None tried ? ?  ? ?Home Medications ?Prior to Admission medications   ?Medication Sig Start Date End Date Taking? Authorizing Provider  ?aspirin 81 MG chewable tablet Chew by mouth daily.   Yes [provider]  ?atorvastatin (LIPITOR) 40 MG tablet Take 40 mg by mouth daily.   Yes [provider]  ?cephALEXin (KEFLEX) 500 MG capsule Take 1 capsule (500 mg total) by mouth 4 (four) times daily for 10 days. 10/29/21 11/08/21 Yes Fransico Meadow, PA-C  ?doxycycline (VIBRAMYCIN) 100 MG capsule Take 1 capsule (100 mg total) by mouth 2 (two) times daily. 10/29/21  Yes Fransico Meadow, PA-C  ?flecainide (TAMBOCOR) 50 MG tablet Take 50 mg by mouth 2 (two) times daily.   Yes [provider]  ?isosorbide dinitrate (ISORDIL) 30 MG tablet Take 60 mg by mouth 2 (two) times daily.   Yes [provider]  ?omeprazole (PRILOSEC) 20 MG capsule Take 20 mg by mouth 2 (two) times daily before a meal.   Yes [provider]  ?rivaroxaban (XARELTO) 20 MG TABS tablet Take 20 mg by mouth daily with supper.   Yes [provider]  ?levofloxacin (LEVAQUIN) 500 MG tablet Take 500 mg by mouth daily.    [provider]  ?methocarbamol (ROBAXIN) 500 MG tablet Take 1 tablet (500 mg total) by mouth every 8 (eight) hours as needed for muscle spasms. 07/29/19   Jose Persia, MD  ?Multiple Vitamin (MULTIVITAMIN) tablet Take 1 tablet by mouth daily.    [provider]  ?   ? ?Allergies    ?Patient has no known allergies.   ? ?Review of Systems   ?Review of Systems  ?All other systems reviewed and are negative. ? ?Physical Exam ?Updated Vital Signs ?BP (!) 154/91   Pulse 92   Temp 98.3 ?F (36.8 ?C) (Oral)   Resp 20   Ht 5\' 10"  (1.778 m)   Wt (!) 204.1 kg   SpO2 96%   BMI 64.57 kg/m?  ?Physical Exam ?Vitals and nursing note reviewed.  ?Constitutional:   ?   General: He is not in acute distress. ?   Appearance:  He is well-developed.  ?HENT:  ?   Head: Normocephalic and atraumatic.  ?Eyes:  ?   Conjunctiva/sclera: Conjunctivae normal.  ?Cardiovascular:  ?   Rate and Rhythm: Normal rate.  ?   Heart sounds: No murmur heard. ?Pulmonary:  ?   Effort: Pulmonary effort is normal. No respiratory distress.  ?Musculoskeletal:     ?   General: No swelling. Normal range of motion.  ?   Comments: Bilateral lower legs discolored slightly purplish.  Tender left lower outer leg.  Full range of motion neurovascular neurosensory are intact  ?Skin: ?   General: Skin is warm and dry.  ?   Capillary Refill: Capillary refill takes less than 2 seconds.  ?Neurological:  ?   Mental Status: He is alert and oriented to person, place, and time.  ?Psychiatric:     ?   Mood and Affect: Mood normal.  ? ? ?ED  Results / Procedures / Treatments   ?Labs ?(all labs ordered are listed, but only abnormal results are displayed) ?Labs Reviewed  ?URINALYSIS, ROUTINE W REFLEX MICROSCOPIC - Abnormal; Notable for the following components:  ?    Result Value  ? Hgb urine dipstick MODERATE (*)   ? Bilirubin Urine SMALL (*)   ? Protein, ur >=300 (*)   ? All other components within normal limits  ?URINALYSIS, MICROSCOPIC (REFLEX) - Abnormal; Notable for the following components:  ? Bacteria, UA MANY (*)   ? All other components within normal limits  ? ? ?EKG ?None ? ?Radiology ?No results found. ? ?Procedures ?Procedures  ? ? ?Medications Ordered in ED ?Medications - No data to display ? ?ED Course/ Medical Decision Making/ A&P ?  ?                        ?Medical Decision Making ?Problems Addressed: ?Pain in left leg: acute illness or injury ?   Details: Pt worried he may be getting cellulitis ?Urinary tract infection without hematuria, site unspecified: acute illness or injury ?   Details: Pt reports frequency and incontince ? ?Amount and/or Complexity of Data Reviewed ?External Data Reviewed: notes. ?   Details: Primary care notes reviewed ?Labs: ordered. Decision-making details documented in ED Course. ?   Details: Ua ordered reviewed and interpreted.  Ua show many bacteria ? ?Risk ?Prescription drug management. ?Risk Details: UA shows urinary tract infection   I do not see any evidence of cellulitis to patient's left lower leg at this time.  Ever given his history I will cover him with antibiotics.  Patient does not have any evidence of DVT.  He is afebrile.  I discussed with him symptoms of worsening infection.  He is advised to return if fever chills or systemic symptoms.  Patient is advised to recheck with his primary care physician on Monday ? ? ? ? ? ? ? ? ? ? ?Final Clinical Impression(s) / ED Diagnoses ?Final diagnoses:  ?Urinary tract infection without hematuria, site unspecified  ?Pain in left leg  ? ? ?Rx / DC Orders ?ED  Discharge Orders   ? ?      Ordered  ?  doxycycline (VIBRAMYCIN) 100 MG capsule  2 times daily       ? 10/29/21 1110  ?  cephALEXin (KEFLEX) 500 MG capsule  4 times daily       ? 10/29/21 1110  ? ?  ?  ? ?  ? ?An After Visit Summary was printed and given to  the patient.  ?  ?Fransico Meadow, PA-C ?10/29/21 1120 ? ?  ?Regan Lemming, MD ?10/29/21 1347 ? ?

## 2021-10-29 NOTE — ED Triage Notes (Signed)
Patient reports that he was having chills yesterday. Patient denies having chills today. States that this happens 2 to 3 times per year. States that he is having pain in his left leg. States that he has cellulitis  in  his left leg. States that when the pain comes in his leg he usually have skin breakdown soon after. He also stated that he wanted to get check for uti because he was voiding yesterday before he could get to the restroom. Denies  any dysuria or hematuria. States that urine has a strong odor. ?
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
